# Patient Record
Sex: Female | Born: 1995
Health system: Southern US, Community
[De-identification: ages and names within clinical notes are randomized; demographics above are authoritative.]

## PROBLEM LIST (undated history)

## (undated) DIAGNOSIS — T7840XA Allergy, unspecified, initial encounter: Secondary | ICD-10-CM

## (undated) DIAGNOSIS — F419 Anxiety disorder, unspecified: Secondary | ICD-10-CM

## (undated) HISTORY — DX: Anxiety disorder, unspecified: F41.9

## (undated) HISTORY — DX: Allergy, unspecified, initial encounter: T78.40XA

## (undated) HISTORY — PX: DENTAL SURGERY: SHX609

## (undated) HISTORY — PX: EYE SURGERY: SHX253

---

## 2016-01-29 ENCOUNTER — Emergency Department (HOSPITAL_COMMUNITY)
Admission: EM | Admit: 2016-01-29 | Discharge: 2016-01-29 | Disposition: A | Discharge status: Home / Self Care | Payer: BLUE CROSS/BLUE SHIELD | Attending: Emergency Medicine | Admitting: Emergency Medicine

## 2016-01-29 ENCOUNTER — Encounter (HOSPITAL_COMMUNITY): Payer: Self-pay | Admitting: Family Medicine

## 2016-01-29 ENCOUNTER — Emergency Department (HOSPITAL_COMMUNITY): Payer: BLUE CROSS/BLUE SHIELD

## 2016-01-29 DIAGNOSIS — R05 Cough: Secondary | ICD-10-CM | POA: Diagnosis not present

## 2016-01-29 DIAGNOSIS — J40 Bronchitis, not specified as acute or chronic: Secondary | ICD-10-CM | POA: Diagnosis not present

## 2016-01-29 DIAGNOSIS — J019 Acute sinusitis, unspecified: Secondary | ICD-10-CM | POA: Diagnosis not present

## 2016-01-29 LAB — URINE MICROSCOPIC-ADD ON

## 2016-01-29 LAB — URINALYSIS, ROUTINE W REFLEX MICROSCOPIC
Bilirubin Urine: NEGATIVE
Glucose, UA: NEGATIVE mg/dL
KETONES UR: NEGATIVE mg/dL
LEUKOCYTES UA: NEGATIVE
NITRITE: NEGATIVE
PROTEIN: NEGATIVE mg/dL
Specific Gravity, Urine: 1.027 (ref 1.005–1.030)
pH: 6 (ref 5.0–8.0)

## 2016-01-29 LAB — CBC WITH DIFFERENTIAL/PLATELET
BASOS ABS: 0 10*3/uL (ref 0.0–0.1)
BASOS PCT: 0 %
EOS ABS: 0.3 10*3/uL (ref 0.0–0.7)
Eosinophils Relative: 5 %
HEMATOCRIT: 39.6 % (ref 36.0–46.0)
HEMOGLOBIN: 13.2 g/dL (ref 12.0–15.0)
Lymphocytes Relative: 29 %
Lymphs Abs: 2 10*3/uL (ref 0.7–4.0)
MCH: 28.9 pg (ref 26.0–34.0)
MCHC: 33.3 g/dL (ref 30.0–36.0)
MCV: 86.8 fL (ref 78.0–100.0)
Monocytes Absolute: 0.9 10*3/uL (ref 0.1–1.0)
Monocytes Relative: 13 %
NEUTROS ABS: 3.7 10*3/uL (ref 1.7–7.7)
NEUTROS PCT: 53 %
Platelets: 292 10*3/uL (ref 150–400)
RBC: 4.56 MIL/uL (ref 3.87–5.11)
RDW: 13.3 % (ref 11.5–15.5)
WBC: 6.9 10*3/uL (ref 4.0–10.5)

## 2016-01-29 LAB — POC URINE PREG, ED: PREG TEST UR: NEGATIVE

## 2016-01-29 LAB — MONONUCLEOSIS SCREEN: MONO SCREEN: NEGATIVE

## 2016-01-29 LAB — RAPID STREP SCREEN (MED CTR MEBANE ONLY): STREPTOCOCCUS, GROUP A SCREEN (DIRECT): NEGATIVE

## 2016-01-29 MED ORDER — AZITHROMYCIN 250 MG PO TABS: 250.0000 mg | ORAL_TABLET | Freq: Every day | ORAL | 0 refills | Status: DC

## 2016-01-29 MED ORDER — PREDNISONE 20 MG PO TABS: 40.0000 mg | ORAL_TABLET | Freq: Every day | ORAL | 0 refills | Status: DC

## 2016-01-29 MED ORDER — ALBUTEROL SULFATE HFA 108 (90 BASE) MCG/ACT IN AERS: 2.0000 | INHALATION_SPRAY | RESPIRATORY_TRACT | 0 refills | Status: DC | PRN

## 2016-01-29 NOTE — ED Provider Notes (Signed)
[facility]-EMERGENCY DEPT Provider Note   CSN: [id number] Arrival date & time: 01/29/16  0034  By signing my name below, I, Heather Cooke, attest that this documentation has been prepared under the direction and in the presence of physician practitioner, Gilda Crease, MD. Electronically Signed: Linna Cooke, Scribe. 01/29/2016. 3:22 AM.  History   Chief Complaint Chief Complaint  Patient presents with  . Cough  . Abdominal Pain    The history is provided by the patient. No language interpreter was used.     HPI Comments: Heather Cooke is a 20 y.o. female who presents to the Emergency Department with multiple complaints over the last two weeks. She endorses generalized myalgias, fever, chills, diaphoresis, sore throat, cough, fatigue, generalized weakness, pain in her lower left ribs, and ear pain. Pt reports she [facility] shortly after her symptoms presented and was diagnosed with pharyngitis. She has been taking OTC medications but reports her sore throat and cough have worsened. She endorses sore throat exacerbation with swallowing and coughing. Pt notes her fever and chills have resolved. She denies numbness, headache, color change, or any other associated symptoms.  History reviewed. No pertinent past medical history.  There are no active problems to display for this patient.   Past Surgical History:  Procedure Laterality Date  . DENTAL SURGERY    . EYE SURGERY      OB History    No data available       Home Medications    Prior to Admission medications   Medication Sig Start Date End Date Taking? Authorizing Provider  albuterol (PROVENTIL HFA;VENTOLIN HFA) 108 (90 Base) MCG/ACT inhaler Inhale 2 puffs into the lungs every 4 (four) hours as needed for wheezing or shortness of breath. 01/29/16   Gilda Crease, MD  azithromycin (ZITHROMAX) 250 MG tablet Take 1 tablet (250 mg total) by mouth daily. Take first 2 tablets together,  then 1 every day until finished. 01/29/16   Gilda Crease, MD  predniSONE (DELTASONE) 20 MG tablet Take 2 tablets (40 mg total) by mouth daily with breakfast. 01/29/16   Gilda Crease, MD    Family History History reviewed. No pertinent family history.  Social History Social History  Substance Use Topics  . Smoking status: Never Smoker  . Smokeless tobacco: Never Used  . Alcohol use Yes     Comment: Once a month     Allergies   Review of patient's allergies indicates not on file.   Review of Systems Review of Systems  Constitutional: Positive for chills (resolved), diaphoresis, fatigue and fever (resolved).  HENT: Positive for ear pain and sore throat.   Respiratory: Positive for cough.   Cardiovascular: Positive for chest pain (left ribs).  Musculoskeletal: Positive for myalgias (generalized).  Skin: Negative for color change.  Neurological: Positive for weakness (generalized). Negative for numbness and headaches.  All other systems reviewed and are negative.   Physical Exam Updated Vital Signs BP 112/72 (BP Location: Left Arm)   Pulse 87   Temp 98.7 F (37.1 C) (Oral)   Resp 20   Ht 5' (1.524 m)   Wt 97 lb (44 kg)   LMP 01/15/2016 (Approximate)   SpO2 98%   BMI 18.94 kg/m   Physical Exam  Constitutional: She is oriented to person, place, and time. She appears well-developed and well-nourished. No distress.  HENT:  Head: Normocephalic and atraumatic.  Right Ear: Hearing normal.  Left Ear: Hearing normal.  Nose: Nose normal.  Mouth/Throat: Mucous membranes are normal. Posterior oropharyngeal erythema present.  Eyes: EOM are normal. Pupils are equal, round, and reactive to light. Right conjunctiva is injected. Left conjunctiva is injected.  Bilateral conjunctival injection without drainage.  Neck: Normal range of motion. Neck supple.  Cardiovascular: Regular rhythm, S1 normal and S2 normal.  Exam reveals no gallop and no friction rub.   No  murmur heard. Pulmonary/Chest: Effort normal and breath sounds normal. No respiratory distress. She exhibits no tenderness.  Abdominal: Soft. Normal appearance and bowel sounds are normal. There is no hepatosplenomegaly. There is tenderness. There is no rebound, no guarding, no tenderness at McBurney's point and negative Murphy's sign. No hernia.  Diffuse upper abdominal tenderness.  Musculoskeletal: Normal range of motion.  Neurological: She is alert and oriented to person, place, and time. She has normal strength. No cranial nerve deficit or sensory deficit. Coordination normal. GCS eye subscore is 4. GCS verbal subscore is 5. GCS motor subscore is 6.  Skin: Skin is warm, dry and intact. No rash noted. No cyanosis.  Psychiatric: She has a normal mood and affect. Her speech is normal and behavior is normal. Thought content normal.  Nursing note and vitals reviewed.   ED Treatments / Results  Labs (all labs ordered are listed, but only abnormal results are displayed) Labs Reviewed  URINALYSIS, ROUTINE W REFLEX MICROSCOPIC (NOT AT [facility]) - Abnormal; Notable for the following:       Result Value   Hgb urine dipstick MODERATE (*)    All other components within normal limits  URINE MICROSCOPIC-ADD ON - Abnormal; Notable for the following:    Squamous Epithelial / LPF 6-30 (*)    Bacteria, UA FEW (*)    All other components within normal limits  RAPID STREP SCREEN (NOT AT [facility])  CULTURE, GROUP A STREP ([facility])  CBC WITH DIFFERENTIAL/PLATELET  MONONUCLEOSIS SCREEN  EPSTEIN-BARR VIRUS VCA, IGG  EPSTEIN-BARR VIRUS VCA, IGM  POC URINE PREG, ED    EKG  EKG Interpretation None       Radiology Dg Chest 2 View  Result Date: 01/29/2016 CLINICAL DATA:  20 year old female with cough and cold symptoms EXAM: CHEST  2 VIEW COMPARISON:  None. FINDINGS: The heart size and mediastinal contours are within normal limits. Both lungs are clear. The visualized skeletal structures are unremarkable.  IMPRESSION: No active cardiopulmonary disease. Electronically Signed   By: Elgie Collard M.D.   On: 01/29/2016 03:51    Procedures Procedures (including critical care time)  DIAGNOSTIC STUDIES: Oxygen Saturation is 99% on RA, normal by my interpretation.    COORDINATION OF CARE: 3:31 AM Discussed treatment plan with pt at bedside and pt agreed to plan.  Medications Ordered in ED Medications - No data to display   Initial Impression / Assessment and Plan / ED Course  I have reviewed the triage vital signs and the nursing notes.  Pertinent labs & imaging results that were available during my care of the patient were reviewed by me and considered in my medical decision making (see chart for details).  Clinical Course    Patient presents to the emergency department for evaluation of flulike symptoms for 2 weeks. Patient has been experiencing cough that is productive of sputum, nasal congestion, sore throat. Symptoms are worsening rather than improving. She has now developed pain in the left side of her abdomen that worsens when she coughs.  Patient appears well, nontoxic. She has normal oxygen saturations. No signs are normal. She is afebrile. Abdominal examination  did reveal some mild tenderness in the left upper quadrant, no hepatosplenomegaly. Rapid strep negative. Chest x-ray clear, no evidence of pneumonia. Mono screen negative.   I personally performed the services described in this documentation, which was scribed in my presence. The recorded information has been reviewed and is accurate.'  Final Clinical Impressions(s) / ED Diagnoses   Final diagnoses:  Acute non-recurrent sinusitis, unspecified location  Bronchitis    New Prescriptions New Prescriptions   ALBUTEROL (PROVENTIL HFA;VENTOLIN HFA) 108 (90 BASE) MCG/ACT INHALER    Inhale 2 puffs into the lungs every 4 (four) hours as needed for wheezing or shortness of breath.   AZITHROMYCIN (ZITHROMAX) 250 MG TABLET     Take 1 tablet (250 mg total) by mouth daily. Take first 2 tablets together, then 1 every day until finished.   PREDNISONE (DELTASONE) 20 MG TABLET    Take 2 tablets (40 mg total) by mouth daily with breakfast.     Gilda Creasehristopher J Khristian Seals, MD 01/29/16 [phone]

## 2016-01-29 NOTE — ED Notes (Signed)
Patient c/o sore throat, cough, and abdominal pain.

## 2016-01-29 NOTE — ED Triage Notes (Addendum)
Patient reports she is experiencing a productive cough, left upper abd pain (close to her ribcage), chest congestion, and nasal drainage for the last two weeks. Symptoms not improving. Pt has took OTC medications with no relief. Also, [facility].

## 2016-01-30 LAB — EPSTEIN-BARR VIRUS VCA, IGM

## 2016-01-30 LAB — EPSTEIN-BARR VIRUS VCA, IGG: EBV VCA IGG: 41.6 U/mL — AB (ref 0.0–17.9)

## 2016-01-31 LAB — CULTURE, GROUP A STREP (THRC)

## 2016-08-31 DIAGNOSIS — Z68.41 Body mass index (BMI) pediatric, 5th percentile to less than 85th percentile for age: Secondary | ICD-10-CM | POA: Diagnosis not present

## 2016-08-31 DIAGNOSIS — R6881 Early satiety: Secondary | ICD-10-CM | POA: Diagnosis not present

## 2016-08-31 DIAGNOSIS — Z23 Encounter for immunization: Secondary | ICD-10-CM | POA: Diagnosis not present

## 2016-08-31 DIAGNOSIS — Z0000 Encounter for general adult medical examination without abnormal findings: Secondary | ICD-10-CM | POA: Diagnosis not present

## 2016-08-31 DIAGNOSIS — Z3202 Encounter for pregnancy test, result negative: Secondary | ICD-10-CM | POA: Diagnosis not present

## 2016-08-31 DIAGNOSIS — Z113 Encounter for screening for infections with a predominantly sexual mode of transmission: Secondary | ICD-10-CM | POA: Diagnosis not present

## 2016-08-31 DIAGNOSIS — F419 Anxiety disorder, unspecified: Secondary | ICD-10-CM | POA: Diagnosis not present

## 2016-09-27 DIAGNOSIS — G9340 Encephalopathy, unspecified: Secondary | ICD-10-CM | POA: Diagnosis not present

## 2016-10-06 DIAGNOSIS — Z111 Encounter for screening for respiratory tuberculosis: Secondary | ICD-10-CM | POA: Diagnosis not present

## 2016-10-06 DIAGNOSIS — Z23 Encounter for immunization: Secondary | ICD-10-CM | POA: Diagnosis not present

## 2016-10-07 DIAGNOSIS — Z118 Encounter for screening for other infectious and parasitic diseases: Secondary | ICD-10-CM | POA: Diagnosis not present

## 2016-10-07 DIAGNOSIS — Z1239 Encounter for other screening for malignant neoplasm of breast: Secondary | ICD-10-CM | POA: Diagnosis not present

## 2016-10-07 DIAGNOSIS — Z112 Encounter for screening for other bacterial diseases: Secondary | ICD-10-CM | POA: Diagnosis not present

## 2016-10-07 DIAGNOSIS — Z01419 Encounter for gynecological examination (general) (routine) without abnormal findings: Secondary | ICD-10-CM | POA: Diagnosis not present

## 2016-10-11 DIAGNOSIS — G9340 Encephalopathy, unspecified: Secondary | ICD-10-CM | POA: Diagnosis not present

## 2016-11-02 DIAGNOSIS — Z3046 Encounter for surveillance of implantable subdermal contraceptive: Secondary | ICD-10-CM | POA: Diagnosis not present

## 2016-11-02 DIAGNOSIS — F419 Anxiety disorder, unspecified: Secondary | ICD-10-CM | POA: Diagnosis not present

## 2016-11-02 DIAGNOSIS — S060X0D Concussion without loss of consciousness, subsequent encounter: Secondary | ICD-10-CM | POA: Diagnosis not present

## 2016-11-02 DIAGNOSIS — R41840 Attention and concentration deficit: Secondary | ICD-10-CM | POA: Diagnosis not present

## 2016-11-04 DIAGNOSIS — S40822A Blister (nonthermal) of left upper arm, initial encounter: Secondary | ICD-10-CM | POA: Diagnosis not present

## 2016-11-19 DIAGNOSIS — G9340 Encephalopathy, unspecified: Secondary | ICD-10-CM | POA: Diagnosis not present

## 2017-02-01 DIAGNOSIS — Z3042 Encounter for surveillance of injectable contraceptive: Secondary | ICD-10-CM | POA: Diagnosis not present

## 2017-02-25 DIAGNOSIS — N6452 Nipple discharge: Secondary | ICD-10-CM | POA: Diagnosis not present

## 2017-03-18 DIAGNOSIS — N6452 Nipple discharge: Secondary | ICD-10-CM | POA: Diagnosis not present

## 2017-03-24 DIAGNOSIS — N6452 Nipple discharge: Secondary | ICD-10-CM | POA: Diagnosis not present

## 2017-05-06 DIAGNOSIS — Z3042 Encounter for surveillance of injectable contraceptive: Secondary | ICD-10-CM | POA: Diagnosis not present

## 2017-05-06 DIAGNOSIS — Z3202 Encounter for pregnancy test, result negative: Secondary | ICD-10-CM | POA: Diagnosis not present

## 2017-07-15 ENCOUNTER — Other Ambulatory Visit: Payer: Self-pay

## 2017-07-15 ENCOUNTER — Emergency Department (HOSPITAL_COMMUNITY)
Admission: EM | Admit: 2017-07-15 | Discharge: 2017-07-16 | Disposition: A | Discharge status: Home / Self Care | Payer: BLUE CROSS/BLUE SHIELD | Attending: Emergency Medicine | Admitting: Emergency Medicine

## 2017-07-15 ENCOUNTER — Emergency Department (HOSPITAL_COMMUNITY): Payer: BLUE CROSS/BLUE SHIELD

## 2017-07-15 ENCOUNTER — Encounter (HOSPITAL_COMMUNITY): Payer: Self-pay

## 2017-07-15 DIAGNOSIS — S6991XA Unspecified injury of right wrist, hand and finger(s), initial encounter: Secondary | ICD-10-CM | POA: Diagnosis not present

## 2017-07-15 DIAGNOSIS — Y9389 Activity, other specified: Secondary | ICD-10-CM | POA: Diagnosis not present

## 2017-07-15 DIAGNOSIS — Y999 Unspecified external cause status: Secondary | ICD-10-CM | POA: Insufficient documentation

## 2017-07-15 DIAGNOSIS — S62141A Displaced fracture of body of hamate [unciform] bone, right wrist, initial encounter for closed fracture: Secondary | ICD-10-CM | POA: Diagnosis not present

## 2017-07-15 DIAGNOSIS — S0083XA Contusion of other part of head, initial encounter: Secondary | ICD-10-CM

## 2017-07-15 DIAGNOSIS — R10815 Periumbilic abdominal tenderness: Secondary | ICD-10-CM | POA: Insufficient documentation

## 2017-07-15 DIAGNOSIS — S62144A Nondisplaced fracture of body of hamate [unciform] bone, right wrist, initial encounter for closed fracture: Secondary | ICD-10-CM | POA: Diagnosis not present

## 2017-07-15 DIAGNOSIS — R079 Chest pain, unspecified: Secondary | ICD-10-CM | POA: Diagnosis not present

## 2017-07-15 DIAGNOSIS — S3991XA Unspecified injury of abdomen, initial encounter: Secondary | ICD-10-CM | POA: Diagnosis not present

## 2017-07-15 DIAGNOSIS — S0990XA Unspecified injury of head, initial encounter: Secondary | ICD-10-CM

## 2017-07-15 DIAGNOSIS — M79641 Pain in right hand: Secondary | ICD-10-CM | POA: Diagnosis not present

## 2017-07-15 DIAGNOSIS — S069X9A Unspecified intracranial injury with loss of consciousness of unspecified duration, initial encounter: Secondary | ICD-10-CM | POA: Diagnosis not present

## 2017-07-15 DIAGNOSIS — S299XXA Unspecified injury of thorax, initial encounter: Secondary | ICD-10-CM | POA: Diagnosis not present

## 2017-07-15 DIAGNOSIS — M25531 Pain in right wrist: Secondary | ICD-10-CM | POA: Diagnosis not present

## 2017-07-15 DIAGNOSIS — Y92410 Unspecified street and highway as the place of occurrence of the external cause: Secondary | ICD-10-CM | POA: Insufficient documentation

## 2017-07-15 DIAGNOSIS — R200 Anesthesia of skin: Secondary | ICD-10-CM | POA: Diagnosis not present

## 2017-07-15 DIAGNOSIS — T1490XA Injury, unspecified, initial encounter: Secondary | ICD-10-CM | POA: Diagnosis not present

## 2017-07-15 LAB — COMPREHENSIVE METABOLIC PANEL
ALK PHOS: 57 U/L (ref 38–126)
ALT: 10 U/L — AB (ref 14–54)
AST: 21 U/L (ref 15–41)
Albumin: 4.4 g/dL (ref 3.5–5.0)
Anion gap: 8 (ref 5–15)
BILIRUBIN TOTAL: 0.6 mg/dL (ref 0.3–1.2)
BUN: 14 mg/dL (ref 6–20)
CALCIUM: 9.7 mg/dL (ref 8.9–10.3)
CO2: 23 mmol/L (ref 22–32)
CREATININE: 0.55 mg/dL (ref 0.44–1.00)
Chloride: 108 mmol/L (ref 101–111)
GFR calc Af Amer: >60 mL/min (ref >60)
GLUCOSE: 93 mg/dL (ref 65–99)
Potassium: 3.9 mmol/L (ref 3.5–5.1)
Sodium: 139 mmol/L (ref 135–145)
TOTAL PROTEIN: 7.6 g/dL (ref 6.5–8.1)

## 2017-07-15 LAB — CBC WITH DIFFERENTIAL/PLATELET
BASOS ABS: 0 10*3/uL (ref 0.0–0.1)
Basophils Relative: 1 %
EOS ABS: 0.1 10*3/uL (ref 0.0–0.7)
Eosinophils Relative: 1 %
HCT: 38.1 % (ref 36.0–46.0)
Hemoglobin: 13 g/dL (ref 12.0–15.0)
Lymphocytes Relative: 28 %
Lymphs Abs: 1.9 10*3/uL (ref 0.7–4.0)
MCH: 29.4 pg (ref 26.0–34.0)
MCHC: 34.1 g/dL (ref 30.0–36.0)
MCV: 86.2 fL (ref 78.0–100.0)
MONO ABS: 0.4 10*3/uL (ref 0.1–1.0)
Monocytes Relative: 6 %
Neutro Abs: 4.5 10*3/uL (ref 1.7–7.7)
Neutrophils Relative %: 64 %
Platelets: 248 10*3/uL (ref 150–400)
RBC: 4.42 MIL/uL (ref 3.87–5.11)
RDW: 13.5 % (ref 11.5–15.5)
WBC: 6.9 10*3/uL (ref 4.0–10.5)

## 2017-07-15 LAB — URINALYSIS, ROUTINE W REFLEX MICROSCOPIC
Bilirubin Urine: NEGATIVE
GLUCOSE, UA: NEGATIVE mg/dL
KETONES UR: 20 mg/dL — AB
LEUKOCYTES UA: NEGATIVE
Nitrite: NEGATIVE
PH: 6 (ref 5.0–8.0)
Protein, ur: NEGATIVE mg/dL
SPECIFIC GRAVITY, URINE: 1.018 (ref 1.005–1.030)

## 2017-07-15 LAB — HCG, QUANTITATIVE, PREGNANCY: hCG, Beta Chain, Quant, S: <1 m[IU]/mL (ref <5)

## 2017-07-15 MED ORDER — IOPAMIDOL (ISOVUE-300) INJECTION 61%
INTRAVENOUS | Status: AC
  Filled 2017-07-15: qty 30

## 2017-07-15 MED ORDER — IOPAMIDOL (ISOVUE-300) INJECTION 61%
30.0000 mL | Freq: Once | INTRAVENOUS | Status: AC | PRN
  Administered 2017-07-16: 30 mL via ORAL
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
## 2017-07-16 ENCOUNTER — Emergency Department (HOSPITAL_COMMUNITY): Payer: BLUE CROSS/BLUE SHIELD

## 2017-07-16 DIAGNOSIS — S62141A Displaced fracture of body of hamate [unciform] bone, right wrist, initial encounter for closed fracture: Secondary | ICD-10-CM | POA: Diagnosis not present

## 2017-07-16 DIAGNOSIS — S3991XA Unspecified injury of abdomen, initial encounter: Secondary | ICD-10-CM | POA: Diagnosis not present

## 2017-07-16 MED ORDER — IOPAMIDOL (ISOVUE-300) INJECTION 61%
100.0000 mL | Freq: Once | INTRAVENOUS | Status: AC | PRN
  Administered 2017-07-16: 95 mL via INTRAVENOUS

## 2017-07-16 MED ORDER — IOPAMIDOL (ISOVUE-300) INJECTION 61%
INTRAVENOUS | Status: AC
  Filled 2017-07-16: qty 100

## 2017-07-20 DIAGNOSIS — M25531 Pain in right wrist: Secondary | ICD-10-CM | POA: Diagnosis not present

## 2017-07-20 DIAGNOSIS — S060X0A Concussion without loss of consciousness, initial encounter: Secondary | ICD-10-CM | POA: Diagnosis not present

## 2017-07-20 DIAGNOSIS — S161XXA Strain of muscle, fascia and tendon at neck level, initial encounter: Secondary | ICD-10-CM | POA: Diagnosis not present

## 2017-07-20 DIAGNOSIS — S62144A Nondisplaced fracture of body of hamate [unciform] bone, right wrist, initial encounter for closed fracture: Secondary | ICD-10-CM | POA: Diagnosis not present

## 2017-07-20 DIAGNOSIS — K130 Diseases of lips: Secondary | ICD-10-CM | POA: Diagnosis not present

## 2017-07-21 NOTE — Telephone Encounter (Signed)
This encounter was created in error - please disregard.

## 2017-07-22 ENCOUNTER — Other Ambulatory Visit: Payer: Self-pay

## 2017-07-22 ENCOUNTER — Ambulatory Visit: Payer: BLUE CROSS/BLUE SHIELD | Admitting: Urgent Care

## 2017-07-22 ENCOUNTER — Encounter: Payer: Self-pay | Admitting: Urgent Care

## 2017-07-22 ENCOUNTER — Ambulatory Visit: Payer: Self-pay | Admitting: Urgent Care

## 2017-07-22 DIAGNOSIS — R51 Headache: Secondary | ICD-10-CM

## 2017-07-22 DIAGNOSIS — F0781 Postconcussional syndrome: Secondary | ICD-10-CM | POA: Diagnosis not present

## 2017-07-22 DIAGNOSIS — R110 Nausea: Secondary | ICD-10-CM | POA: Diagnosis not present

## 2017-07-22 DIAGNOSIS — R519 Headache, unspecified: Secondary | ICD-10-CM

## 2017-07-22 MED ORDER — NAPROXEN SODIUM 550 MG PO TABS: 550.0000 mg | ORAL_TABLET | Freq: Two times a day (BID) | ORAL | 1 refills | Status: DC
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
## 2017-07-22 NOTE — Patient Instructions (Addendum)
Hydrate well with at least 2 liters (1 gallon) of water daily. Use naproxen for headaches. Eat 3 regular meals every day and get 8 hours of sleep nightly.   Post-Concussion Syndrome Post-concussion syndrome describes the symptoms that can occur after a head injury. These symptoms can last from weeks to months. What are the causes? It is not clear why some head injuries cause post-concussion syndrome. It can occur whether your head injury was mild or severe and whether you were wearing head protection or not. What are the signs or symptoms?  Memory difficulties.  Dizziness.  Headaches.  Double vision or blurry vision.  Sensitivity to light.  Hearing difficulties.  Depression.  Tiredness.  Weakness.  Difficulty with concentration.  Difficulty sleeping or staying asleep.  Vomiting.  Poor balance or instability on your feet.  Slow reaction time.  Difficulty learning and remembering things you have heard. How is this diagnosed? There is no test to determine whether you have post-concussion syndrome. Your health care provider may order an imaging scan of your brain, such as a CT scan, to check for other problems that may be causing your symptoms (such as a severe injury inside your skull). How is this treated? Usually, these problems disappear over time without medical care. Your health care provider may prescribe medicine to help ease your symptoms. It is important to follow up with a neurologist to evaluate your recovery and address any lingering symptoms or issues. Follow these instructions at home:  Take medicines only as directed by your health care provider. Do not take aspirin. Aspirin can slow blood clotting.  Sleep with your head slightly elevated to help with headaches.  Avoid any situation where there is potential for another head injury. This includes football, hockey, soccer, basketball, martial arts, downhill snow sports, and horseback riding. Your condition  will get worse every time you experience a concussion. You should avoid these activities until you are evaluated by the appropriate follow-up health care providers.  Keep all follow-up visits as directed by your health care provider. This is important. Contact a health care provider if:  You have increased problems paying attention or concentrating.  You have increased difficulty remembering or learning new information.  You need more time to complete tasks or assignments than before.  You have increased irritability or decreased ability to cope with stress.  You have more symptoms than before. Seek medical care if you have any of the following symptoms for more than two weeks after your injury:  Lasting (chronic) headaches.  Dizziness or balance problems.  Nausea.  Vision problems.  Increased sensitivity to noise or light.  Depression or mood swings.  Anxiety or irritability.  Memory problems.  Difficulty concentrating or paying attention.  Sleep problems.  Feeling tired all the time.  Get help right away if:  You have confusion or unusual drowsiness.  Others find it difficult to wake you up.  You have nausea or persistent, forceful vomiting.  You feel like you are moving when you are not (vertigo). Your eyes may move rapidly back and forth.  You have convulsions or faint.  You have severe, persistent headaches that are not relieved by medicine.  You cannot use your arms or legs normally.  One of your pupils is larger than the other.  You have clear or bloody discharge from your nose or ears.  Your problems are getting worse, not better. This information is not intended to replace advice given to you by your health care provider.  Make sure you discuss any questions you have with your health care provider. Document Released: 09/18/2001 Document Revised: 10/17/2015 Document Reviewed: 07/04/2013 Elsevier Interactive Patient Education  2018 [employer]     IF you received an x-ray today, you will receive an invoice from [facility]. Please contact [facility] at [phone] with questions or concerns regarding your invoice.   IF you received labwork today, you will receive an invoice from [facility]. Please contact [facility] at [phone] with questions or concerns regarding your invoice.   Our billing staff will not be able to assist you with questions regarding bills from these companies.  You will be contacted with the lab results as soon as they are available. The fastest way to get your results is to activate your My Chart account. Instructions are located on the last page of this paperwork. If you have not heard from us regarding the results in 2 weeks, please contact this office.

## 2017-07-23 DIAGNOSIS — Z3042 Encounter for surveillance of injectable contraceptive: Secondary | ICD-10-CM | POA: Diagnosis not present

## 2017-08-02 ENCOUNTER — Ambulatory Visit: Payer: BLUE CROSS/BLUE SHIELD | Admitting: Urgent Care

## 2017-08-05 ENCOUNTER — Ambulatory Visit: Payer: BLUE CROSS/BLUE SHIELD | Admitting: Urgent Care

## 2017-08-05 ENCOUNTER — Encounter: Payer: Self-pay | Admitting: Urgent Care

## 2017-08-05 DIAGNOSIS — F0781 Postconcussional syndrome: Secondary | ICD-10-CM | POA: Diagnosis not present

## 2017-08-05 DIAGNOSIS — R51 Headache: Secondary | ICD-10-CM | POA: Diagnosis not present

## 2017-08-05 DIAGNOSIS — R110 Nausea: Secondary | ICD-10-CM

## 2017-08-05 DIAGNOSIS — R519 Headache, unspecified: Secondary | ICD-10-CM
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
## 2017-08-05 NOTE — Patient Instructions (Addendum)
Post-Concussion Syndrome Post-concussion syndrome describes the symptoms that can occur after a head injury. These symptoms can last from weeks to months. What are the causes? It is not clear why some head injuries cause post-concussion syndrome. It can occur whether your head injury was mild or severe and whether you were wearing head protection or not. What are the signs or symptoms?  Memory difficulties.  Dizziness.  Headaches.  Double vision or blurry vision.  Sensitivity to light.  Hearing difficulties.  Depression.  Tiredness.  Weakness.  Difficulty with concentration.  Difficulty sleeping or staying asleep.  Vomiting.  Poor balance or instability on your feet.  Slow reaction time.  Difficulty learning and remembering things you have heard. How is this diagnosed? There is no test to determine whether you have post-concussion syndrome. Your health care provider may order an imaging scan of your brain, such as a CT scan, to check for other problems that may be causing your symptoms (such as a severe injury inside your skull). How is this treated? Usually, these problems disappear over time without medical care. Your health care provider may prescribe medicine to help ease your symptoms. It is important to follow up with a neurologist to evaluate your recovery and address any lingering symptoms or issues. Follow these instructions at home:  Take medicines only as directed by your health care provider. Do not take aspirin. Aspirin can slow blood clotting.  Sleep with your head slightly elevated to help with headaches.  Avoid any situation where there is potential for another head injury. This includes football, hockey, soccer, basketball, martial arts, downhill snow sports, and horseback riding. Your condition will get worse every time you experience a concussion. You should avoid these activities until you are evaluated by the appropriate follow-up health care  providers.  Keep all follow-up visits as directed by your health care provider. This is important. Contact a health care provider if:  You have increased problems paying attention or concentrating.  You have increased difficulty remembering or learning new information.  You need more time to complete tasks or assignments than before.  You have increased irritability or decreased ability to cope with stress.  You have more symptoms than before. Seek medical care if you have any of the following symptoms for more than two weeks after your injury:  Lasting (chronic) headaches.  Dizziness or balance problems.  Nausea.  Vision problems.  Increased sensitivity to noise or light.  Depression or mood swings.  Anxiety or irritability.  Memory problems.  Difficulty concentrating or paying attention.  Sleep problems.  Feeling tired all the time.  Get help right away if:  You have confusion or unusual drowsiness.  Others find it difficult to wake you up.  You have nausea or persistent, forceful vomiting.  You feel like you are moving when you are not (vertigo). Your eyes may move rapidly back and forth.  You have convulsions or faint.  You have severe, persistent headaches that are not relieved by medicine.  You cannot use your arms or legs normally.  One of your pupils is larger than the other.  You have clear or bloody discharge from your nose or ears.  Your problems are getting worse, not better. This information is not intended to replace advice given to you by your health care provider. Make sure you discuss any questions you have with your health care provider. Document Released: 09/18/2001 Document Revised: 10/17/2015 Document Reviewed: 07/04/2013 Elsevier Interactive Patient Education  2018 [employer]  IF you received an x-ray today, you will receive an invoice from [facility]. Please contact [facility] at [phone] with  questions or concerns regarding your invoice.   IF you received labwork today, you will receive an invoice from [facility]. Please contact [facility] at [phone] with questions or concerns regarding your invoice.   Our billing staff will not be able to assist you with questions regarding bills from these companies.  You will be contacted with the lab results as soon as they are available. The fastest way to get your results is to activate your My Chart account. Instructions are located on the last page of this paperwork. If you have not heard from [place] regarding the results in 2 weeks, please contact this office.

## 2017-08-09 ENCOUNTER — Encounter: Payer: Self-pay | Admitting: Urgent Care

## 2017-08-24 DIAGNOSIS — M25531 Pain in right wrist: Secondary | ICD-10-CM | POA: Diagnosis not present

## 2017-08-24 DIAGNOSIS — S62144A Nondisplaced fracture of body of hamate [unciform] bone, right wrist, initial encounter for closed fracture: Secondary | ICD-10-CM | POA: Diagnosis not present

## 2017-10-21 DIAGNOSIS — Z3042 Encounter for surveillance of injectable contraceptive: Secondary | ICD-10-CM | POA: Diagnosis not present

## 2017-10-21 DIAGNOSIS — Z23 Encounter for immunization: Secondary | ICD-10-CM | POA: Diagnosis not present

## 2017-11-17 DIAGNOSIS — N898 Other specified noninflammatory disorders of vagina: Secondary | ICD-10-CM | POA: Diagnosis not present

## 2017-11-17 DIAGNOSIS — N921 Excessive and frequent menstruation with irregular cycle: Secondary | ICD-10-CM | POA: Diagnosis not present

## 2018-01-12 DIAGNOSIS — Z3042 Encounter for surveillance of injectable contraceptive: Secondary | ICD-10-CM | POA: Diagnosis not present

## 2018-01-12 DIAGNOSIS — Z23 Encounter for immunization: Secondary | ICD-10-CM | POA: Diagnosis not present

## 2018-01-17 DIAGNOSIS — Z23 Encounter for immunization: Secondary | ICD-10-CM | POA: Diagnosis not present

## 2018-01-17 DIAGNOSIS — N921 Excessive and frequent menstruation with irregular cycle: Secondary | ICD-10-CM | POA: Diagnosis not present

## 2018-01-17 DIAGNOSIS — Z124 Encounter for screening for malignant neoplasm of cervix: Secondary | ICD-10-CM | POA: Diagnosis not present

## 2018-01-17 DIAGNOSIS — Z112 Encounter for screening for other bacterial diseases: Secondary | ICD-10-CM | POA: Diagnosis not present

## 2018-01-17 DIAGNOSIS — Z01419 Encounter for gynecological examination (general) (routine) without abnormal findings: Secondary | ICD-10-CM | POA: Diagnosis not present

## 2018-03-08 DIAGNOSIS — N921 Excessive and frequent menstruation with irregular cycle: Secondary | ICD-10-CM | POA: Diagnosis not present

## 2018-03-08 DIAGNOSIS — N930 Postcoital and contact bleeding: Secondary | ICD-10-CM | POA: Diagnosis not present

## 2018-03-08 DIAGNOSIS — N941 Unspecified dyspareunia: Secondary | ICD-10-CM | POA: Diagnosis not present

## 2018-04-26 ENCOUNTER — Encounter: Payer: Self-pay | Admitting: Emergency Medicine

## 2018-04-26 ENCOUNTER — Ambulatory Visit
Admission: EM | Admit: 2018-04-26 | Discharge: 2018-04-26 | Disposition: A | Discharge status: Home / Self Care | Payer: BLUE CROSS/BLUE SHIELD | Attending: Physician Assistant | Admitting: Physician Assistant

## 2018-04-26 DIAGNOSIS — J069 Acute upper respiratory infection, unspecified: Secondary | ICD-10-CM

## 2018-04-26 MED ORDER — BENZONATATE 100 MG PO CAPS: 100.0000 mg | ORAL_CAPSULE | Freq: Three times a day (TID) | ORAL | 0 refills | Status: AC

## 2018-04-26 MED ORDER — IBUPROFEN 600 MG PO TABS: 600.0000 mg | ORAL_TABLET | Freq: Four times a day (QID) | ORAL | 0 refills | Status: AC | PRN

## 2018-04-26 NOTE — ED Notes (Signed)
Patient able to ambulate independently  

## 2018-04-26 NOTE — Discharge Instructions (Signed)
Return if any problems.

## 2018-04-26 NOTE — ED Provider Notes (Signed)
[facility]    CSN: [id number] Arrival date & time: 04/26/18  1232     History   Chief Complaint Chief Complaint  Patient presents with  . URI    HPI Heather Cooke is a 23 y.o. female.   The history is provided by the patient. No language interpreter was used.  URI  Presenting symptoms: congestion and cough   Severity:  Moderate Onset quality:  Gradual Timing:  Constant Progression:  Worsening Chronicity:  New Relieved by:  Nothing Worsened by:  Nothing Ineffective treatments:  None tried Risk factors: sick contacts     Past Medical History:  Diagnosis Date  . Allergy   . Anxiety     There are no active problems to display for this patient.   Past Surgical History:  Procedure Laterality Date  . DENTAL SURGERY    . EYE SURGERY      OB History   No obstetric history on file.      Home Medications    Prior to Admission medications   Medication Sig Start Date End Date Taking? Authorizing Provider  Estradiol Cypionate (DEPO-ESTRADIOL IM) Depo-Estradiol   Yes [provider]  benzonatate (TESSALON) 100 MG capsule Take 1 capsule (100 mg total) by mouth every 8 (eight) hours. 04/26/18   Elson AreasSofia, Noah Pelaez K, PA-C  ibuprofen (ADVIL,MOTRIN) 600 MG tablet Take 1 tablet (600 mg total) by mouth every 6 (six) hours as needed. 04/26/18   Elson AreasSofia, Adylee Leonardo K, PA-C    Family History History reviewed. No pertinent family history.  Social History Social History   Tobacco Use  . Smoking status: Never Smoker  . Smokeless tobacco: Never Used  Substance Use Topics  . Alcohol use: Yes    Comment: Once a month  . Drug use: No     Allergies   Shellfish allergy; Amoxicillin; Cefprozil; and Povidone-iodine   Review of Systems Review of Systems  HENT: Positive for congestion.   Respiratory: Positive for cough.   All other systems reviewed and are negative.    Physical Exam Triage Vital Signs ED Triage Vitals  Enc Vitals Group     BP  04/26/18 1248 126/65     Pulse Rate 04/26/18 1248 92     Resp 04/26/18 1248 18     Temp 04/26/18 1248 98.2 F (36.8 C)     Temp Source 04/26/18 1248 Oral     SpO2 04/26/18 1248 97 %     Weight --      Height --      Head Circumference --      Peak Flow --      Pain Score 04/26/18 1249 7     Pain Loc --      Pain Edu? --      Excl. in GC? --    No data found.  Updated Vital Signs BP 126/65 (BP Location: Left Arm)   Pulse 92   Temp 98.2 F (36.8 C) (Oral)   Resp 18   SpO2 97%   Visual Acuity Right Eye Distance:   Left Eye Distance:   Bilateral Distance:    Right Eye Near:   Left Eye Near:    Bilateral Near:     Physical Exam Constitutional:      Appearance: She is well-developed.  HENT:     Head: Normocephalic and atraumatic.     Right Ear: Tympanic membrane normal.     Left Ear: Tympanic membrane normal.     Nose: Nose normal.  Mouth/Throat:     Pharynx: Posterior oropharyngeal erythema present.  Eyes:     Conjunctiva/sclera: Conjunctivae normal.     Pupils: Pupils are equal, round, and reactive to light.  Neck:     Musculoskeletal: Normal range of motion and neck supple.  Cardiovascular:     Rate and Rhythm: Normal rate and regular rhythm.  Pulmonary:     Effort: Pulmonary effort is normal.  Abdominal:     Palpations: Abdomen is soft.  Musculoskeletal: Normal range of motion.  Skin:    General: Skin is warm and dry.  Neurological:     Mental Status: She is alert and oriented to person, place, and time.  Psychiatric:        Mood and Affect: Mood normal.      UC Treatments / Results  Labs (all labs ordered are listed, but only abnormal results are displayed) Labs Reviewed - No data to display  EKG None  Radiology No results found.  Procedures Procedures (including critical care time)  Medications Ordered in UC Medications - No data to display  Initial Impression / Assessment and Plan / UC Course  I have reviewed the triage vital  signs and the nursing notes.  Pertinent labs & imaging results that were available during my care of the patient were reviewed by me and considered in my medical decision making (see chart for details).     MDM  I suspect viral illness  Final Clinical Impressions(s) / UC Diagnoses   Final diagnoses:  Acute upper respiratory infection     Discharge Instructions     Return if any problems.    ED Prescriptions    Medication Sig Dispense Auth. Provider   ibuprofen (ADVIL,MOTRIN) 600 MG tablet Take 1 tablet (600 mg total) by mouth every 6 (six) hours as needed. 30 tablet Keyshun Elpers K, [place]PA-C   benzonatate (TESSALON) 100 MG capsule Take 1 capsule (100 mg total) by mouth every 8 (eight) hours. 21 capsule Elson AreasSofia, Tyechia Allmendinger K, [place]PA-C     Controlled Substance Prescriptions [place] Controlled Substance Registry consulted? Not Applicable   Elson AreasSofia, Riccardo Holeman K, [place]PA-C 04/26/18 1301

## 2018-04-26 NOTE — ED Triage Notes (Signed)
[facility], congestion, body aches, chills, and now developing back pain x 2 days.

## 2018-05-18 ENCOUNTER — Ambulatory Visit
Admission: EM | Admit: 2018-05-18 | Discharge: 2018-05-18 | Disposition: A | Discharge status: Home / Self Care | Payer: BLUE CROSS/BLUE SHIELD | Attending: Family Medicine | Admitting: Family Medicine

## 2018-05-18 DIAGNOSIS — N898 Other specified noninflammatory disorders of vagina: Secondary | ICD-10-CM | POA: Insufficient documentation

## 2018-05-18 DIAGNOSIS — N926 Irregular menstruation, unspecified: Secondary | ICD-10-CM | POA: Diagnosis not present

## 2018-05-18 LAB — POCT URINE PREGNANCY: Preg Test, Ur: NEGATIVE

## 2018-05-18 MED ORDER — MEDROXYPROGESTERONE ACETATE 10 MG PO TABS: 10.0000 mg | ORAL_TABLET | Freq: Every day | ORAL | 0 refills | Status: AC

## 2018-05-18 NOTE — Discharge Instructions (Signed)
Urine pregnancy negative.   We will start you on provera.  Take as directed and to completion Follow up with GYN for further evaluation and management if symptoms persists  Vaginal self-swab obtained We will follow up with you regarding the results of your test If tests are positive, please abstain from sexual activity for at least 7 days and notify partners Follow up with PCP if symptoms persists Return here or go to ER if you have any new or worsening symptoms fever, chills, nausea, vomiting, abdominal or pelvic pain, painful intercourse, vaginal discharge, vaginal bleeding, persistent symptoms despite treatment, etc...Marland Kitchen

## 2018-05-18 NOTE — ED Triage Notes (Signed)
Pt c/o vaginal odor, denies discharge, states been on menstral cycle for 15days d/t birth control pills. States hx of yeast infection

## 2018-05-18 NOTE — ED Provider Notes (Signed)
[facility]   [id number] 05/18/18 Arrival Time: 1534   JX:[id number]:VAGINAL odor and vaginal bleeding  SUBJECTIVE:  Heather Cooke is a 23 y.o. female who presents with complaint of foul vaginal odor x 2 days.  Currently on menstrual cycle.  Last sexual activity 2-3 weeks ago.  Has not tried OTC medications.  Denies worsening factors.  Denies previous symptoms.  She denies fever, chills, nausea, vomiting, abdominal or pelvic pain, urinary symptoms, vaginal itching, dyspareunia, vaginal rashes or lesions.   Also mentions vaginal bleeding x 15 days related to cycle. Reports hx of irregular cycles.  Recently changed birth control from depo shot to estrogen pills. Has been on estrogen pills for 1.5 months without relief. Patient states she is using 3 tampons and pads per day, but is not soaking through.  Does not complain of clots.    Patient's last menstrual period was 05/04/2018. ROS: As per HPI.  Past Medical History:  Diagnosis Date  . Allergy   . Anxiety    Past Surgical History:  Procedure Laterality Date  . DENTAL SURGERY    . EYE SURGERY     Allergies  Allergen Reactions  . Shellfish Allergy   . Amoxicillin Rash    Has patient had a PCN reaction causing immediate rash, facial/tongue/throat swelling, SOB or lightheadedness with hypotension: Yes Has patient had a PCN reaction causing severe rash involving mucus membranes or skin necrosis: No Has patient had a PCN reaction that required hospitalization: No Has patient had a PCN reaction occurring within the last 10 years: No If all of the above answers are "NO", then may proceed with Cephalosporin use.   Marland Kitchen. Cefprozil Rash  . Povidone-Iodine Rash   No current facility-administered medications on file prior to encounter.    Current Outpatient Medications on File Prior to Encounter  Medication Sig Dispense Refill  . norethindrone (AYGESTIN) 5 MG tablet Take 1 mg by mouth daily.    . benzonatate (TESSALON) 100 MG capsule  Take 1 capsule (100 mg total) by mouth every 8 (eight) hours. 21 capsule 0  . ibuprofen (ADVIL,MOTRIN) 600 MG tablet Take 1 tablet (600 mg total) by mouth every 6 (six) hours as needed. 30 tablet 0    Social History   Socioeconomic History  . Marital status: Single    Spouse name: Not on file  . Number of children: Not on file  . Years of education: Not on file  . Highest education level: Not on file  Occupational History  . Not on file  Social Needs  . Financial resource strain: Not on file  . Food insecurity:    Worry: Not on file    Inability: Not on file  . Transportation needs:    Medical: Not on file    Non-medical: Not on file  Tobacco Use  . Smoking status: Never Smoker  . Smokeless tobacco: Never Used  Substance and Sexual Activity  . Alcohol use: Yes    Comment: Once a month  . Drug use: No  . Sexual activity: Not on file  Lifestyle  . Physical activity:    Days per week: Not on file    Minutes per session: Not on file  . Stress: Not on file  Relationships  . Social connections:    Talks on phone: Not on file    Gets together: Not on file    Attends religious service: Not on file    Active member of club or organization: Not on file  Attends meetings of clubs or organizations: Not on file    Relationship status: Not on file  . Intimate partner violence:    Fear of current or ex partner: Not on file    Emotionally abused: Not on file    Physically abused: Not on file    Forced sexual activity: Not on file  Other Topics Concern  . Not on file  Social History Narrative  . Not on file   No family history on file.  OBJECTIVE:  Vitals:   05/18/18 1546  BP: 119/75  Pulse: 84  Resp: 18  Temp: 98.1 F (36.7 C)  TempSrc: Oral  SpO2: 98%     General appearance: Alert, NAD, appears stated age Head: NCAT Throat: lips, mucosa, and tongue normal; teeth and gums normal Lungs: CTA bilaterally without adventitious breath sounds Heart: regular rate and  rhythm.  Radial pulses 2+ symmetrical bilaterally Abdomen: soft, non-tender; bowel sounds normal; no guarding GU: deferred; vaginal swab obtained Skin: warm and dry Psychological:  Alert and cooperative. Normal mood and affect.  LABS:   Labs Reviewed  POCT URINE PREGNANCY - Normal  CERVICOVAGINAL ANCILLARY ONLY    ASSESSMENT & PLAN:  1. Vaginal odor   2. Irregular menses     Meds ordered this encounter  Medications  . medroxyPROGESTERone (PROVERA) 10 MG tablet    Sig: Take 1 tablet (10 mg total) by mouth daily.    Dispense:  10 tablet    Refill:  0    Order Specific Question:   Supervising Provider    Answer:   Eustace Moore [[id number]]    Pending: Labs Reviewed  POCT URINE PREGNANCY - Normal  CERVICOVAGINAL ANCILLARY ONLY   Urine pregnancy negative.   We will start you on provera.  Take as directed and to completion Follow up with GYN for further evaluation and management if symptoms persists  Vaginal self-swab obtained We will follow up with you regarding the results of your test If tests are positive, please abstain from sexual activity for at least 7 days and notify partners Follow up with PCP if symptoms persists Return here or go to ER if you have any new or worsening symptoms fever, chills, nausea, vomiting, abdominal or pelvic pain, painful intercourse, vaginal discharge, vaginal bleeding, persistent symptoms despite treatment, etc...  Reviewed expectations re: course of current medical issues. Questions answered. Outlined signs and symptoms indicating need for more acute intervention. Patient verbalized understanding. After Visit Summary given.       Rennis Harding, PA-C 05/18/18 1641

## 2018-05-22 LAB — CERVICOVAGINAL ANCILLARY ONLY
Bacterial vaginitis: NEGATIVE
Candida vaginitis: POSITIVE — AB
Chlamydia: NEGATIVE
Neisseria Gonorrhea: NEGATIVE
TRICH (WINDOWPATH): NEGATIVE

## 2018-05-23 ENCOUNTER — Telehealth (HOSPITAL_COMMUNITY): Payer: Self-pay | Admitting: Emergency Medicine

## 2018-05-23 MED ORDER — FLUCONAZOLE 150 MG PO TABS: 150.0000 mg | ORAL_TABLET | Freq: Once | ORAL | 0 refills | Status: AC

## 2018-05-23 NOTE — Telephone Encounter (Signed)
Patient contacted and made aware of all results, all questions answered.   

## 2018-05-23 NOTE — Telephone Encounter (Signed)
Test for candida (yeast) was positive.  Prescription for fluconazole 150mg po now, repeat dose in 3d if needed, #2 no refills, sent to the pharmacy of record.  Recheck or followup with PCP for further evaluation if symptoms are not improving.    Attempted to reach patient. No answer at this time. Voicemail left.     

## 2018-07-25 DIAGNOSIS — N898 Other specified noninflammatory disorders of vagina: Secondary | ICD-10-CM | POA: Diagnosis not present

## 2018-07-25 DIAGNOSIS — N926 Irregular menstruation, unspecified: Secondary | ICD-10-CM | POA: Diagnosis not present

## 2018-07-25 DIAGNOSIS — B9689 Other specified bacterial agents as the cause of diseases classified elsewhere: Secondary | ICD-10-CM | POA: Diagnosis not present

## 2018-07-25 DIAGNOSIS — N760 Acute vaginitis: Secondary | ICD-10-CM | POA: Diagnosis not present

## 2018-07-25 DIAGNOSIS — Z23 Encounter for immunization: Secondary | ICD-10-CM | POA: Diagnosis not present

## 2018-08-10 ENCOUNTER — Other Ambulatory Visit: Payer: Self-pay

## 2018-08-10 ENCOUNTER — Ambulatory Visit (HOSPITAL_COMMUNITY)
Admission: EM | Admit: 2018-08-10 | Discharge: 2018-08-10 | Disposition: A | Discharge status: Home / Self Care | Payer: BLUE CROSS/BLUE SHIELD | Attending: Family Medicine | Admitting: Family Medicine

## 2018-08-10 ENCOUNTER — Encounter (HOSPITAL_COMMUNITY): Payer: Self-pay

## 2018-08-10 DIAGNOSIS — S060X9A Concussion with loss of consciousness of unspecified duration, initial encounter: Secondary | ICD-10-CM | POA: Diagnosis not present

## 2018-08-10 NOTE — Discharge Instructions (Addendum)
Please eat well, get plenty of rest and stay well hydrated  Please perform light exercises  Please avoid things that trigger your symptoms  Please follow up if you have ongoing symptoms.
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
## 2018-08-14 DIAGNOSIS — L299 Pruritus, unspecified: Secondary | ICD-10-CM | POA: Diagnosis not present

## 2018-08-14 DIAGNOSIS — J309 Allergic rhinitis, unspecified: Secondary | ICD-10-CM | POA: Diagnosis not present

## 2018-08-14 DIAGNOSIS — L509 Urticaria, unspecified: Secondary | ICD-10-CM | POA: Diagnosis not present

## 2018-08-21 DIAGNOSIS — L299 Pruritus, unspecified: Secondary | ICD-10-CM | POA: Diagnosis not present

## 2018-08-21 DIAGNOSIS — L509 Urticaria, unspecified: Secondary | ICD-10-CM | POA: Diagnosis not present

## 2018-08-21 DIAGNOSIS — J309 Allergic rhinitis, unspecified: Secondary | ICD-10-CM | POA: Diagnosis not present
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
## 2018-08-22 DIAGNOSIS — L299 Pruritus, unspecified: Secondary | ICD-10-CM | POA: Diagnosis not present

## 2018-08-22 DIAGNOSIS — J309 Allergic rhinitis, unspecified: Secondary | ICD-10-CM | POA: Diagnosis not present

## 2018-08-22 DIAGNOSIS — L509 Urticaria, unspecified: Secondary | ICD-10-CM | POA: Diagnosis not present

## 2018-11-27 DIAGNOSIS — L209 Atopic dermatitis, unspecified: Secondary | ICD-10-CM | POA: Diagnosis not present

## 2019-01-23 DIAGNOSIS — Z3202 Encounter for pregnancy test, result negative: Secondary | ICD-10-CM | POA: Diagnosis not present

## 2019-01-23 DIAGNOSIS — A749 Chlamydial infection, unspecified: Secondary | ICD-10-CM | POA: Diagnosis not present

## 2019-01-23 DIAGNOSIS — Z23 Encounter for immunization: Secondary | ICD-10-CM | POA: Diagnosis not present

## 2019-01-23 DIAGNOSIS — R110 Nausea: Secondary | ICD-10-CM | POA: Diagnosis not present

## 2019-01-23 DIAGNOSIS — Z113 Encounter for screening for infections with a predominantly sexual mode of transmission: Secondary | ICD-10-CM | POA: Diagnosis not present

## 2019-02-04 DIAGNOSIS — B373 Candidiasis of vulva and vagina: Secondary | ICD-10-CM | POA: Diagnosis not present

## 2019-02-27 DIAGNOSIS — Z113 Encounter for screening for infections with a predominantly sexual mode of transmission: Secondary | ICD-10-CM | POA: Diagnosis not present

## 2019-03-04 DIAGNOSIS — M5441 Lumbago with sciatica, right side: Secondary | ICD-10-CM | POA: Diagnosis not present

## 2019-03-04 DIAGNOSIS — N926 Irregular menstruation, unspecified: Secondary | ICD-10-CM | POA: Diagnosis not present

## 2019-05-22 DIAGNOSIS — Z20822 Contact with and (suspected) exposure to covid-19: Secondary | ICD-10-CM | POA: Diagnosis not present

## 2019-06-16 DIAGNOSIS — R002 Palpitations: Secondary | ICD-10-CM | POA: Diagnosis not present

## 2019-06-16 DIAGNOSIS — Z8616 Personal history of COVID-19: Secondary | ICD-10-CM | POA: Diagnosis not present

## 2019-06-16 DIAGNOSIS — R42 Dizziness and giddiness: Secondary | ICD-10-CM | POA: Diagnosis not present

## 2019-06-16 DIAGNOSIS — Z881 Allergy status to other antibiotic agents status: Secondary | ICD-10-CM | POA: Diagnosis not present

## 2019-06-16 DIAGNOSIS — R0602 Shortness of breath: Secondary | ICD-10-CM | POA: Diagnosis not present

## 2019-06-16 DIAGNOSIS — R079 Chest pain, unspecified: Secondary | ICD-10-CM | POA: Diagnosis not present

## 2019-06-19 DIAGNOSIS — R002 Palpitations: Secondary | ICD-10-CM | POA: Diagnosis not present

## 2019-07-06 DIAGNOSIS — R002 Palpitations: Secondary | ICD-10-CM | POA: Diagnosis not present

## 2019-07-10 DIAGNOSIS — R002 Palpitations: Secondary | ICD-10-CM | POA: Diagnosis not present

## 2019-08-31 DIAGNOSIS — Z20822 Contact with and (suspected) exposure to covid-19: Secondary | ICD-10-CM | POA: Diagnosis not present

## 2019-09-07 DIAGNOSIS — J069 Acute upper respiratory infection, unspecified: Secondary | ICD-10-CM | POA: Diagnosis not present

## 2019-09-07 DIAGNOSIS — H6122 Impacted cerumen, left ear: Secondary | ICD-10-CM | POA: Diagnosis not present

## 2019-09-07 DIAGNOSIS — J029 Acute pharyngitis, unspecified: Secondary | ICD-10-CM | POA: Diagnosis not present

## 2019-09-11 DIAGNOSIS — J069 Acute upper respiratory infection, unspecified: Secondary | ICD-10-CM | POA: Diagnosis not present

## 2019-11-08 DIAGNOSIS — N939 Abnormal uterine and vaginal bleeding, unspecified: Secondary | ICD-10-CM | POA: Diagnosis not present

## 2019-11-08 DIAGNOSIS — Z112 Encounter for screening for other bacterial diseases: Secondary | ICD-10-CM | POA: Diagnosis not present

## 2019-11-08 DIAGNOSIS — Z118 Encounter for screening for other infectious and parasitic diseases: Secondary | ICD-10-CM | POA: Diagnosis not present

## 2019-11-08 DIAGNOSIS — N946 Dysmenorrhea, unspecified: Secondary | ICD-10-CM | POA: Diagnosis not present

## 2019-11-22 DIAGNOSIS — Z20822 Contact with and (suspected) exposure to covid-19: Secondary | ICD-10-CM | POA: Diagnosis not present

## 2019-12-14 DIAGNOSIS — B373 Candidiasis of vulva and vagina: Secondary | ICD-10-CM | POA: Diagnosis not present

## 2019-12-14 DIAGNOSIS — Z202 Contact with and (suspected) exposure to infections with a predominantly sexual mode of transmission: Secondary | ICD-10-CM | POA: Diagnosis not present

## 2020-01-08 DIAGNOSIS — Z113 Encounter for screening for infections with a predominantly sexual mode of transmission: Secondary | ICD-10-CM | POA: Diagnosis not present

## 2020-01-20 DIAGNOSIS — Z113 Encounter for screening for infections with a predominantly sexual mode of transmission: Secondary | ICD-10-CM | POA: Diagnosis not present
